# Patient Record
Sex: Female | Born: 1965 | Race: White | Hispanic: No | Marital: Single | Smoking: Never smoker
Health system: Southern US, Community
[De-identification: ages and names within clinical notes are randomized; demographics above are authoritative.]

## PROBLEM LIST (undated history)

## (undated) HISTORY — PX: MYOMECTOMY: SHX85

---

## 1997-08-26 ENCOUNTER — Other Ambulatory Visit: Admission: RE | Admit: 1997-08-26 | Discharge: 1997-08-26 | Payer: Self-pay | Admitting: Obstetrics and Gynecology

## 1998-09-07 ENCOUNTER — Other Ambulatory Visit: Admission: RE | Admit: 1998-09-07 | Discharge: 1998-09-07 | Payer: Self-pay | Admitting: Obstetrics and Gynecology

## 1998-10-19 ENCOUNTER — Other Ambulatory Visit: Admission: RE | Admit: 1998-10-19 | Discharge: 1998-10-19 | Payer: Self-pay | Admitting: Obstetrics and Gynecology

## 1999-01-12 ENCOUNTER — Encounter (INDEPENDENT_AMBULATORY_CARE_PROVIDER_SITE_OTHER): Payer: Self-pay | Admitting: Specialist

## 1999-01-12 ENCOUNTER — Other Ambulatory Visit: Admission: RE | Admit: 1999-01-12 | Discharge: 1999-01-12 | Payer: Self-pay | Admitting: Obstetrics and Gynecology

## 1999-05-19 ENCOUNTER — Other Ambulatory Visit: Admission: RE | Admit: 1999-05-19 | Discharge: 1999-05-19 | Payer: Self-pay | Admitting: Obstetrics and Gynecology

## 1999-05-19 ENCOUNTER — Encounter (INDEPENDENT_AMBULATORY_CARE_PROVIDER_SITE_OTHER): Payer: Self-pay

## 1999-09-28 ENCOUNTER — Other Ambulatory Visit: Admission: RE | Admit: 1999-09-28 | Discharge: 1999-09-28 | Payer: Self-pay | Admitting: Obstetrics and Gynecology

## 1999-11-04 ENCOUNTER — Encounter (INDEPENDENT_AMBULATORY_CARE_PROVIDER_SITE_OTHER): Payer: Self-pay | Admitting: Specialist

## 1999-11-04 ENCOUNTER — Other Ambulatory Visit: Admission: RE | Admit: 1999-11-04 | Discharge: 1999-11-04 | Payer: Self-pay | Admitting: Obstetrics and Gynecology

## 2000-04-18 ENCOUNTER — Other Ambulatory Visit: Admission: RE | Admit: 2000-04-18 | Discharge: 2000-04-18 | Payer: Self-pay | Admitting: Obstetrics and Gynecology

## 2001-02-13 ENCOUNTER — Other Ambulatory Visit: Admission: RE | Admit: 2001-02-13 | Discharge: 2001-02-13 | Payer: Self-pay | Admitting: Obstetrics and Gynecology

## 2001-08-13 ENCOUNTER — Other Ambulatory Visit: Admission: RE | Admit: 2001-08-13 | Discharge: 2001-08-13 | Payer: Self-pay | Admitting: Obstetrics and Gynecology

## 2001-09-17 ENCOUNTER — Encounter: Admission: RE | Admit: 2001-09-17 | Discharge: 2001-09-17 | Payer: Self-pay | Admitting: Family Medicine

## 2001-09-17 ENCOUNTER — Encounter: Payer: Self-pay | Admitting: Family Medicine

## 2002-03-14 ENCOUNTER — Other Ambulatory Visit: Admission: RE | Admit: 2002-03-14 | Discharge: 2002-03-14 | Payer: Self-pay | Admitting: Obstetrics and Gynecology

## 2002-10-17 ENCOUNTER — Other Ambulatory Visit: Admission: RE | Admit: 2002-10-17 | Discharge: 2002-10-17 | Payer: Self-pay | Admitting: Obstetrics and Gynecology

## 2003-07-30 ENCOUNTER — Other Ambulatory Visit: Admission: RE | Admit: 2003-07-30 | Discharge: 2003-07-30 | Payer: Self-pay | Admitting: Obstetrics and Gynecology

## 2004-01-31 ENCOUNTER — Emergency Department (HOSPITAL_COMMUNITY): Admission: EM | Admit: 2004-01-31 | Discharge: 2004-01-31 | Payer: Self-pay | Admitting: Internal Medicine

## 2004-03-23 ENCOUNTER — Inpatient Hospital Stay (HOSPITAL_COMMUNITY): Admission: RE | Admit: 2004-03-23 | Discharge: 2004-03-25 | Payer: Self-pay | Admitting: Obstetrics and Gynecology

## 2004-03-23 ENCOUNTER — Encounter (INDEPENDENT_AMBULATORY_CARE_PROVIDER_SITE_OTHER): Payer: Self-pay | Admitting: Specialist

## 2004-10-06 ENCOUNTER — Other Ambulatory Visit: Admission: RE | Admit: 2004-10-06 | Discharge: 2004-10-06 | Payer: Self-pay | Admitting: Obstetrics and Gynecology

## 2005-01-28 ENCOUNTER — Emergency Department (HOSPITAL_COMMUNITY): Admission: EM | Admit: 2005-01-28 | Discharge: 2005-01-28 | Payer: Self-pay | Admitting: Family Medicine

## 2007-12-23 ENCOUNTER — Encounter (INDEPENDENT_AMBULATORY_CARE_PROVIDER_SITE_OTHER): Payer: Self-pay | Admitting: Obstetrics & Gynecology

## 2007-12-23 ENCOUNTER — Inpatient Hospital Stay (HOSPITAL_COMMUNITY): Admission: AD | Admit: 2007-12-23 | Discharge: 2007-12-26 | Payer: Self-pay | Admitting: Obstetrics & Gynecology

## 2010-07-12 ENCOUNTER — Other Ambulatory Visit: Payer: Self-pay | Admitting: Dermatology

## 2010-10-19 NOTE — Op Note (Signed)
NAMENATAKI, MCCRUMB NO.:  [id number]   MEDICAL RECORD NO.:  [id number]          PATIENT TYPE:  INP   LOCATION:  9145                          FACILITY:  [facility]   PHYSICIAN:  Freddy Finner, M.D.   DATE OF BIRTH:  10-Nov-1965   DATE OF PROCEDURE:  12/23/2007  DATE OF DISCHARGE:                               OPERATIVE REPORT   PREOPERATIVE DIAGNOSES:  1. Intrauterine pregnancy at 36 weeks' gestation.  2. Previous myomectomy.  3. Recently diagnosed placenta previa in early labor.   POSTOPERATIVE DIAGNOSES:  1. Intrauterine pregnancy at 13 weeks' gestation.  2. Previous myomectomy.  3. Recently diagnosed placenta previa in early labor.  4. Delivery of viable female infant, Apgar's of 9 at one minute and 9 at      five minutes.  5. Posterior placenta with edge crossing the cervix.   ANESTHESIA:  Spinal.   SURGEON:  W. Varney Baas, MD   ASSISTANT:  Dr. Senaida Ores.   ESTIMATED INTRAOPERATIVE BLOOD LOSS:  Less than or equal to 800 mL.   INTRAOPERATIVE COMPLICATIONS:  None.   PACKS, DRAINS AND CATHETERS:  Foley catheter.  No packs or drains.   CONDITION:  Good to the recovery room.   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  The viable twin  is now at 28 weeks' gestation.  She was recently diagnosed with a  placenta previa on ultrasound in the office.  She had an episode of  spotting today and came to the hospital.  There was no active bleeding  at the time of her arrival.  She was having some mild contractions and  was given IV fluids and subcutaneous terbutaline.  This temporarily  resolved the contractions, which recurred.  She was tentatively  scheduled for delivery on December 26, 2007, anyway and given the  circumstances, it was elected to proceed with cesarean delivery at this  time.   PROCEDURE IN DETAIL:  The patient was brought to the operating room and  placed under  adequate spinal anesthesia, placed in dorsal recumbent  position with elevation of the right hip of 15 degrees.  The abdomen was  prepped and draped in the usual fashion after placement of a Foley  catheter using sterile technique.  She was given Ancef bolus IV.  Sterile drapes were applied.  A transverse lower abdominal incision was  made through an old scar and carried sharply down the fascia.  Fascia  was entered sharply with a scalpel and extended with Mayo scissors to  the external skin incision.  Rectus sheath was developed superiorly and  inferiorly with blunt and sharp dissection. Rectus muscles were divided  in the midline.  Peritoneum was elevated and entered sharply and  extended bluntly to the external skin incision.  A transverse incision  was made in the vesicoperitoneum overlying the lower segment and the  bladder dissected off the lower segment.  The incision on the uterus was  intentionally somewhat high and was made in a transverse direction.  Fluid was  clear.  The infant was then delivered with vacuum and  extracted without difficulty.  There were 2 loose loops of nuchal cord.  Apgar's are noted above.  Cord blood was obtained for routine screening  and for arterial blood gases.  Placenta and other parts of conception  were removed from the uterus.  This was confirmed by manual exploration  of the uterine cavity.  Placenta was sent to pathology.  Uterine  incision was grasped with ring forceps.  There was marked venous  bleeding because of the placenta previa, but this was easily controlled  with a running-locking stitch of 0 Monocryl, which closed the lower  segment.  One figure-of-eight was required near the left margin of the  incision for complete hemostasis.  Irrigation was carried out.  Hemostasis was complete.  The uterus itself was palpably normal.  There  were some probable omental-type adhesions on the posterior lower uterine  segment on the right side that  were left in place.  The tubes and  ovaries were normal.  No palpable additional myomas were noted.  With  correct instrument counts, the abdominal incision was closed in layers.  Running 0 Monocryl was used to close peritoneum and reapproximate the  rectus muscles.  Fascia was closed with a looped 0 PDS running from  angle to angle on either side.  Skin was closed with wide skin staples  and 1/4-inch Steri-Strips.  The patient was taken to the recovery room  in good condition.      Freddy Finner, M.D.  Electronically Signed     WRN/MEDQ  D:  12/23/2007  T:  12/24/2007  Job:  [id number]

## 2010-10-22 NOTE — Discharge Summary (Signed)
Allison Flores, Allison Flores                ACCOUNT NO.:  [id number]   MEDICAL RECORD NO.:  [id number]          PATIENT TYPE:  INP   LOCATION:  9145                          FACILITY:  [facility]   PHYSICIAN:  Dineen Kid. Rana Snare, M.D.    DATE OF BIRTH:  Oct 01, 1965   DATE OF ADMISSION:  12/23/2007  DATE OF DISCHARGE:  12/26/2007                               DISCHARGE SUMMARY   ADMITTING DIAGNOSES:  1. Intrauterine pregnancy at 67 weeks' estimated gestational age.  2. Previous myomectomy.  3. History of placenta previa.  4. Early onset of labor.   DISCHARGE DIAGNOSES:  1. Status post low-transverse cesarean section.  2. Viable female infant.   PROCEDURE:  Primary low-transverse cesarean section.   REASON FOR ADMISSION:  Please see written H&P.   HOSPITAL COURSE:  The patient is a 45 year old primigravida that was  admitted to [facility] at 36 weeks' estimated  gestational age in early labor.  The patient had had a history of a  myomectomy and also had had a known placenta previa.  The patient did  present to the hospital with early onset of labor with spotting and some  mild irregular contractions.  The patient was scheduled for cesarean  delivery in approximately 3 days.  The patient's prenatal history had  been significant for a twin gestation at the beginning of her pregnancy  with reabsorption of one of the twins.  The patient was counseled  regarding delivery at the time of her admission, and decision was made  to proceed with a cesarean delivery.  The patient was then transferred  to the operating room, where spinal anesthesia was administered without  difficulty.  A low-transverse incision was made with delivery of a  viable female infant weighing 7 pounds 0 ounces with Apgars of 9 at one  minute and 9 at five minutes.  The patient tolerated the procedure well  and was taken to the recovery room in stable condition.  On  postoperative day #1, the patient was without complaint.   Vital signs  were stable.  She was afebrile.  Fundus firm and nontender.  Abdominal  dressing was noted to be clean, dry, and intact.  Laboratory findings  showed a hemoglobin of 9.2.  On postoperative day #2, the patient was  without complaint.  Vital signs remained stable.  She was afebrile.  Abdomen soft.  Fundus firm and nontender.  Incision was clean, dry, and  intact.  She was ambulating well.  On postoperative day #3, the patient  was without complaint.  Vital signs were stable.  She was afebrile.  Fundus firm and nontender.  Incision was clean, dry, and intact.  Staples removed and the patient was later discharged home.   CONDITION ON DISCHARGE:  Stable.   DIET:  Regular as tolerated.   ACTIVITY:  No heavy lifting, no driving x2 weeks, no vaginal entry.   FOLLOWUP:  The patient to follow up in the office in 1-2 weeks for an  incision check.  She is to call for temperature greater than 100  degrees, persistent nausea, vomiting, heavy  vaginal bleeding, and/or  redness or drainage from the incisional site.   DISCHARGE MEDICATIONS:  Tylox #30 one p.o. every 4-6 hours p.r.n.,  Motrin 600 mg every 6 hours, prenatal vitamins one p.o. daily.      Julio Sicks, N.P.      Dineen Kid Rana Snare, M.D.  Electronically Signed    CC/MEDQ  D:  01/29/2008  T:  01/30/2008  Job:  [id number]

## 2010-10-22 NOTE — Discharge Summary (Signed)
[name], Allison Flores NO.:  [id number]   MEDICAL RECORD NO.:  [id number]          PATIENT TYPE:  INP   LOCATION:  0446                         [facility]ACILITY:  [facility]   PHYSICIAN:  Juluis Mire, M.D.   DATE OF BIRTH:  1966/04/02   DATE OF ADMISSION:  03/23/2004  DATE OF DISCHARGE:  03/25/2004                                 DISCHARGE SUMMARY   ADMISSION DIAGNOSIS:  Uterine fibroids.   DISCHARGE DIAGNOSIS:  Uterine fibroids.   PROCEDURE:  Exploratory laparotomy with myomectomy.   HISTORY OF PRESENT ILLNESS:  For complete history and physical, please see  dictated note.   HOSPITAL COURSE:  The patient underwent the above-noted surgery.  Pathology  is still pending from the surgical specimen.  Postoperative hemoglobin was  11.1.  She was discharged home on the second postoperative day, tolerating a  regular diet and ambulating without difficulty.  She was passing flatus and  voiding without difficulty.  The low transverse incision was intact.  Having  minimal vaginal bleeding.   No intraoperative complications.  __________ stay in the hospital.  The  patient is discharged home in stable condition.   DISPOSITION:  The patient is to avoid heavy lifting, vaginal entry, or  driving a car.  She was discharged home with __________ as needed for pain.  She is to watch for signs of infection, nausea, vomiting, increased  abdominal pain, or heavy vaginal bleeding.   FOLLOW UP:  She is to follow up early next week to remove the staples.      JSM/MEDQ  D:  03/25/2004  T:  03/25/2004  Job:  [id number]

## 2010-10-22 NOTE — H&P (Signed)
Allison Flores, [place] NO.:  [id number]   MEDICAL RECORD NO.:  [id number]          PATIENT TYPE:  INP   LOCATION:  0007                         FACILITY:  [facility]   PHYSICIAN:  Juluis Mire, M.D.   DATE OF BIRTH:  Oct 21, 1965   DATE OF ADMISSION:  03/23/2004  DATE OF DISCHARGE:                                HISTORY & PHYSICAL   REASON FOR ADMISSION:  Patient is a 45 year old nulligravida female who  presents for myomectomy.   HISTORY:  In relation to the present admission, patient has a history of  uterine fibroids.  Her cycles have become increasingly heavy.  She reports  10 days of total flow, 3-5 days being heavy changing pads and tampons every  30 minutes with clots.  She has had some increasing dysmenorrhea; her  hemoglobin has been depressed as low as 9.8; she is on birth control pills  at the present time.  Subsequent reevaluation of the uterine fibroids  revealed a large fibroid with endometrial impingement, the fibroid measured  approximately 8 x 6 cm; she had smaller fibroids measuring up to 2.8 cm;  ovaries were unremarkable.  We stabilized her periods with the birth control  pills.  Subsequently, with iron sulfate supplementation, we were able to  increase her hemoglobin to 13.3 but because of continued bleeding issues  associated with the fibroids she now presents for myomectomy.  We have  discussed other alternatives including radiological embolization versus  continued attempt at management with hormonal agents versus hysterectomy.  Patient does wish to proceed with myomectomy.   ALLERGIES:  No known drug allergies.   MEDICATIONS:  Medications include birth control pills.   PAST MEDICAL HISTORY:  Usual childhood diseases, no significant sequelae.   PAST SURGICAL HISTORY:  No previous surgical history.   PAST OBSTETRICAL HISTORY:  No previous obstetrical history.   FAMILY HISTORY:  Noncontributory.   SOCIAL HISTORY:  There is no tobacco or  alcohol use.   REVIEW OF SYSTEMS:  Noncontributory.   PHYSICAL EXAMINATION:  VITAL SIGNS:  Patient is afebrile with stable vital  signs.  HEENT:  Patient normocephalic.  Pupils equal, round, reactive to light and  accommodation.  Extraocular movements are intact.  Sclerae and conjunctivae  clear.  Oropharynx clear.  NECK:  Without thyromegaly.  BREASTS:  No discrete masses.  LUNGS:  Clear.  CARDIOVASCULAR SYSTEM:  Regular rhythm and rate without murmurs or gallops.  ABDOMEN:  Benign.  No mass, organomegaly, or tenderness.  PELVIC:  Normal external genitalia.  Vaginal mucosa clear.  Cervix  unremarkable.  Uterus enlarged 8-9 weeks with large prominent fibroid noted.  Adnexa unremarkable.  EXTREMITIES:  Trace edema.  NEUROLOGICAL:  Grossly within normal limits.   IMPRESSION:  Uterine fibroids with associated menorrhagia.   PLAN:  The patient will undergo myomectomy.  The risks have been discussed  including the risk of infection.  The risk of hemorrhage that could require  transfusion with associated risk of AIDS or hepatitis.  Possible need for  hysterectomy.  Possible injury to adjacent organs including bladder, bowel,  ureters that could require  further exploratory surgery.  Risk of deep venous  thrombosis and pulmonary embolus.  We have explained to her alternatives  again.  We have discussed with her the risk of myomectomy in terms of  leading to pelvic adhesions that could lead to infertility issues.  There is  also the risk of recurrent fibroids requiring further management concerns.  Patient expressed understanding of indications, risks, and above issues.      JSM/MEDQ  D:  03/23/2004  T:  03/23/2004  Job:  [id number]

## 2010-10-22 NOTE — Op Note (Signed)
Allison, Flores NO.:  [id number]   MEDICAL RECORD NO.:  [id number]          PATIENT TYPE:  INP   LOCATION:  0007                         [facility]ACILITY:  [facility]   PHYSICIAN:  Juluis Mire, M.D.   DATE OF BIRTH:  1965/10/09   DATE OF PROCEDURE:  03/23/2004  DATE OF DISCHARGE:                                 OPERATIVE REPORT   PREOPERATIVE DIAGNOSES:  Uterine fibroids.   POSTOPERATIVE DIAGNOSES:  Uterine fibroids.   OPERATION:  Exploratory laparotomy with multiple myomectomies.   SURGEON:  Juluis Mire, M.D.   ASSISTANT:  Duke Salvia. Marcelle Overlie, M.D.   ANESTHESIA:  General endotracheal.   ESTIMATED BLOOD LOSS:  Approximately 300 mL.   PACKS/DRAINS:  None.   INTRAOPERATIVE BLOOD REPLACED:  None.   COMPLICATIONS:  None.   INDICATIONS FOR PROCEDURE:  As dictated in the history and physical.   DESCRIPTION OF PROCEDURE:  The patient was taken to the OR and placed in the  supine position. After a satisfactory level of general endotracheal  anesthesia was obtained, the abdomen was prepped out with Betadine and  draped in a sterile field.  A low transverse skin incision was made with a  knife and carried through the subcutaneous tissue. The fascia was entered  sharply, incision in the fascia extended laterally. The fascia was taken  down off the muscle inferiorly and superiorly.  The rectus muscles were  separated in the midline.  The perineum was entered, incision in the  perineum extended both superiorly and inferiorly. The uterus was then  delivered through the incision. Multiple fibroids were noted. The tubes and  ovaries were unremarkable. She had a large posterior fibroid. The area was  injected with a dilute solution of Pitressin and saline. An incision over  the fibroid was made using the Bovie. We dissected down to the fibroid. We  were able to remove approximately an 8 cm fibroid through this incision. The  endometrium was entered during this  procedure and the myometrium was noted,  there did not appear to be any tubal disruption. At this point in time, we  reapproximated the endometrium with a running suture of 3-0 Monocryl. Next,  the myometrium was reapproximated with interrupted figure-of-eights of 2-0  Vicryl. A second layer of 2-0 Vicryl in a running fashion was used to bring  the myometrium closer together. We had fairly good hemostasis.  The uterine  serosa was brought together with a running suture of 3-0 Monocryl. Next, two  pedunculated subserosal fibroids, one on the right fundal area and one on  the left fundal area were injected with dilute solution of Pitressin and  saline and excised. There was not a deep myometrial incision so the uterine  serosa was brought together with a running suture of 3-0 Monocryl with good  hemostasis. We then went to the anterior portion of the uterus.  There was  one 2 cm fibroid located there, it was well below the bladder area.  Again  this was injected with Pitressin and saline. An incision was made with the  Bovie over the fibroid  and it was removed easily. The deep myometrium was  closed with two figure-of-eights of 2-0 Vicryl, the serosa was closed with a  running suture of 3-0 Monocryl. One posterior fibroid on the lower uterine  segment was noted. This was pedunculated and actually just came off in our  hand. We over sewed the area with 3-0 Monocryl brining about hemostasis. At  this point in time, we thoroughly irrigated the pelvis, we had good  hemostasis.  All the incision areas appeared to be doing well. The tubes and  ovaries again were unremarkable, no other pelvic processes were noted. At  this point in time, the uterus was returned to the abdominal cavity.  Again  we irrigated, we had good hemostasis, muscles reapproximated with running  suture of 3-0 Vicryl, the fascia closed with running suture #0 PDS, skin was  closed with staples and Steri-Strips. Sponge, instrument  and needle count  reported as correct by circulating nurse x2, Foley catheter remained clear  at the time of closure. The patient tolerated the procedure well and was  returned to the recovery room in good condition.      JSM/MEDQ  D:  03/23/2004  T:  03/23/2004  Job:  [id number]

## 2011-03-04 LAB — TYPE AND SCREEN: ABO/RH(D): A POS

## 2011-03-04 LAB — CBC
HCT: 36.7
Hemoglobin: 12.5
MCHC: 34.1
MCV: 94.3
Platelets: 167
Platelets: 229
RBC: 3.89
RDW: 14.1
RDW: 14.2
WBC: 10.2
WBC: 11.9 — ABNORMAL HIGH

## 2011-03-04 LAB — COMPREHENSIVE METABOLIC PANEL
ALT: 13
AST: 20
Albumin: 2.6 — ABNORMAL LOW
Calcium: 8.9
Chloride: 104
Creatinine, Ser: 0.43
GFR calc Af Amer: >60
Sodium: 136
Total Bilirubin: 0.4

## 2011-03-04 LAB — URIC ACID: Uric Acid, Serum: 3.5

## 2011-03-04 LAB — ABO/RH: ABO/RH(D): A POS

## 2011-07-20 ENCOUNTER — Other Ambulatory Visit: Payer: Self-pay | Admitting: Dermatology

## 2011-09-14 ENCOUNTER — Other Ambulatory Visit: Payer: Self-pay | Admitting: Dermatology

## 2012-09-28 ENCOUNTER — Other Ambulatory Visit: Payer: Self-pay | Admitting: Obstetrics and Gynecology

## 2012-09-28 DIAGNOSIS — R928 Other abnormal and inconclusive findings on diagnostic imaging of breast: Secondary | ICD-10-CM

## 2012-10-11 ENCOUNTER — Ambulatory Visit
Admission: RE | Admit: 2012-10-11 | Discharge: 2012-10-11 | Disposition: A | Discharge status: Home / Self Care | Payer: BC Managed Care – PPO | Source: Ambulatory Visit | Attending: Obstetrics and Gynecology | Admitting: Obstetrics and Gynecology

## 2012-10-11 DIAGNOSIS — R928 Other abnormal and inconclusive findings on diagnostic imaging of breast: Secondary | ICD-10-CM

## 2013-02-20 ENCOUNTER — Emergency Department (HOSPITAL_COMMUNITY)
Admission: EM | Admit: 2013-02-20 | Discharge: 2013-02-20 | Disposition: A | Discharge status: Home / Self Care | Payer: BC Managed Care – PPO | Source: Home / Self Care | Attending: Emergency Medicine | Admitting: Emergency Medicine

## 2013-02-20 ENCOUNTER — Encounter (HOSPITAL_COMMUNITY): Payer: Self-pay | Admitting: Emergency Medicine

## 2013-02-20 DIAGNOSIS — J020 Streptococcal pharyngitis: Secondary | ICD-10-CM

## 2013-02-20 LAB — POCT RAPID STREP A: Streptococcus, Group A Screen (Direct): POSITIVE — AB

## 2013-02-20 MED ORDER — PENICILLIN G BENZATHINE 1200000 UNIT/2ML IM SUSP
INTRAMUSCULAR | Status: AC
  Filled 2013-02-20: qty 2

## 2013-02-20 MED ORDER — PENICILLIN G BENZATHINE 1200000 UNIT/2ML IM SUSP
1.2000 10*6.[IU] | Freq: Once | INTRAMUSCULAR | Status: AC
  Administered 2013-02-20: 1.2 10*6.[IU] via INTRAMUSCULAR

## 2013-02-20 NOTE — ED Provider Notes (Signed)
Chief Complaint:   Chief Complaint  Patient presents with  . Sore Throat    History of Present Illness:   Allison Flores is a 47 year old female who brought her son in 5 days ago with strep. At that time she had a scratchy throat which has now become sore and she has pain on swallowing. She also feels achy, has had a fever of up to 101, chills, nasal congestion, rhinorrhea, headache, and a slight cough. She denies any GI symptoms. She has had a history of strep throat in the past.  Review of Systems:  Other than as noted above, the patient denies any of the following symptoms. Systemic:  No fever, chills, sweats, fatigue, myalgias, headache, or anorexia. Eye:  No redness, pain or drainage. ENT:  No earache, ear congestion, nasal congestion, sneezing, rhinorrhea, sinus pressure, sinus pain, or post nasal drip. Lungs:  No cough, sputum production, wheezing, shortness of breath, or chest pain. GI:  No abdominal pain, nausea, vomiting, or diarrhea. Skin:  No rash or itching.  PMFSH:  Past medical history, family history, social history, meds, allergies, and nurse's notes were reviewed.  There is no known exposure to strep or mono.  No prior history of step or mono.  The patient denies use of tobacco.   Physical Exam:   Vital signs:  BP 123/86  Pulse 76  Temp(Src) 98.3 F (36.8 C) (Oral)  Resp 18  SpO2 96%  LMP 01/23/2013 General:  Alert, in no distress. Eye:  No conjunctival injection or drainage. Lids were normal. ENT:  TMs and canals were normal, without erythema or inflammation.  Nasal mucosa was clear and uncongested, without drainage.  Mucous membranes were moist.  Exam of pharynx reveals swelling and tonsillar exudate.  There were no oral ulcerations or lesions. Neck:  Supple, anterior cervical nodes were enlarged and tender. Lungs:  No respiratory distress.  Lungs were clear to auscultation, without wheezes, rales or rhonchi.  Breath sounds were clear and equal bilaterally.  Heart:   Regular rhythm, without gallops, murmers or rubs. Skin:  Clear, warm, and dry, without rash or lesions.  Labs:   Results for orders placed during the hospital encounter of 02/20/13  POCT RAPID STREP A ([facility] CARE ONLY)      Result Value Range   Streptococcus, Group A Screen (Direct) POSITIVE (*) NEGATIVE    Course in Urgent Care Center:   Given LA Bicillin 1.2 million units IM.  Assessment:  The encounter diagnosis was Strep throat.  No evidence of peritonsillar abscess.  Plan:   1.  The following meds were prescribed:   New Prescriptions   No medications on file   2.  The patient was instructed in symptomatic care including hot saline gargles, throat lozenges, infectious precautions, and need to trade out toothbrush. Handouts were given. 3.  The patient was told to return if becoming worse in any way, if no better in 3 or 4 days, and given some red flag symptoms such as difficulty swallowing or breathing that would indicate earlier return. 4.  Follow up here if necessary.    Reuben Likes, MD 02/20/13 [phone]

## 2013-02-20 NOTE — ED Notes (Signed)
Pt c/o sore throat onset Saturday Pt's son was seen here on Sat for a pos strep Sxs today include: fevers, BA, ST, facial pressure Took 800mg  of ibup this am around 0400 She is alert w/no signs of acute distress.

## 2013-08-05 ENCOUNTER — Other Ambulatory Visit: Payer: Self-pay | Admitting: Obstetrics and Gynecology

## 2013-08-05 DIAGNOSIS — N6320 Unspecified lump in the left breast, unspecified quadrant: Secondary | ICD-10-CM

## 2013-08-16 ENCOUNTER — Ambulatory Visit
Admission: RE | Admit: 2013-08-16 | Discharge: 2013-08-16 | Disposition: A | Discharge status: Home / Self Care | Payer: BC Managed Care – PPO | Source: Ambulatory Visit | Attending: Obstetrics and Gynecology | Admitting: Obstetrics and Gynecology

## 2013-08-16 DIAGNOSIS — N6320 Unspecified lump in the left breast, unspecified quadrant: Secondary | ICD-10-CM

## 2014-10-16 ENCOUNTER — Other Ambulatory Visit: Payer: Self-pay | Admitting: Obstetrics and Gynecology

## 2014-10-17 LAB — CYTOLOGY - PAP

## 2016-10-12 ENCOUNTER — Other Ambulatory Visit: Payer: Self-pay | Admitting: Family Medicine

## 2016-10-12 ENCOUNTER — Ambulatory Visit
Admission: RE | Admit: 2016-10-12 | Discharge: 2016-10-12 | Disposition: A | Discharge status: Home / Self Care | Payer: 59 | Source: Ambulatory Visit | Attending: Family Medicine | Admitting: Family Medicine

## 2016-10-12 DIAGNOSIS — X501XXA Overexertion from prolonged static or awkward postures, initial encounter: Secondary | ICD-10-CM

## 2016-10-12 DIAGNOSIS — M25531 Pain in right wrist: Secondary | ICD-10-CM | POA: Diagnosis not present

## 2016-10-12 DIAGNOSIS — S6391XA Sprain of unspecified part of right wrist and hand, initial encounter: Secondary | ICD-10-CM | POA: Diagnosis not present

## 2016-11-01 DIAGNOSIS — Z01419 Encounter for gynecological examination (general) (routine) without abnormal findings: Secondary | ICD-10-CM | POA: Diagnosis not present

## 2016-11-09 DIAGNOSIS — Z13820 Encounter for screening for osteoporosis: Secondary | ICD-10-CM | POA: Diagnosis not present

## 2017-02-24 DIAGNOSIS — Z1211 Encounter for screening for malignant neoplasm of colon: Secondary | ICD-10-CM | POA: Diagnosis not present

## 2017-03-06 DIAGNOSIS — Z23 Encounter for immunization: Secondary | ICD-10-CM | POA: Diagnosis not present

## 2017-03-06 DIAGNOSIS — E7800 Pure hypercholesterolemia, unspecified: Secondary | ICD-10-CM | POA: Diagnosis not present

## 2017-03-06 DIAGNOSIS — Z0000 Encounter for general adult medical examination without abnormal findings: Secondary | ICD-10-CM | POA: Diagnosis not present

## 2017-03-06 DIAGNOSIS — E559 Vitamin D deficiency, unspecified: Secondary | ICD-10-CM | POA: Diagnosis not present

## 2017-03-06 DIAGNOSIS — I10 Essential (primary) hypertension: Secondary | ICD-10-CM | POA: Diagnosis not present

## 2017-03-29 DIAGNOSIS — L821 Other seborrheic keratosis: Secondary | ICD-10-CM | POA: Diagnosis not present

## 2017-03-29 DIAGNOSIS — D2361 Other benign neoplasm of skin of right upper limb, including shoulder: Secondary | ICD-10-CM | POA: Diagnosis not present

## 2017-03-29 DIAGNOSIS — D225 Melanocytic nevi of trunk: Secondary | ICD-10-CM | POA: Diagnosis not present

## 2017-10-16 DIAGNOSIS — Z780 Asymptomatic menopausal state: Secondary | ICD-10-CM | POA: Diagnosis not present

## 2017-12-04 DIAGNOSIS — Z6834 Body mass index (BMI) 34.0-34.9, adult: Secondary | ICD-10-CM | POA: Diagnosis not present

## 2017-12-04 DIAGNOSIS — Z1231 Encounter for screening mammogram for malignant neoplasm of breast: Secondary | ICD-10-CM | POA: Diagnosis not present

## 2017-12-04 DIAGNOSIS — Z01419 Encounter for gynecological examination (general) (routine) without abnormal findings: Secondary | ICD-10-CM | POA: Diagnosis not present

## 2018-02-08 DIAGNOSIS — Z780 Asymptomatic menopausal state: Secondary | ICD-10-CM | POA: Diagnosis not present

## 2018-03-26 DIAGNOSIS — I10 Essential (primary) hypertension: Secondary | ICD-10-CM | POA: Diagnosis not present

## 2018-03-26 DIAGNOSIS — Z0000 Encounter for general adult medical examination without abnormal findings: Secondary | ICD-10-CM | POA: Diagnosis not present

## 2018-03-26 DIAGNOSIS — Z23 Encounter for immunization: Secondary | ICD-10-CM | POA: Diagnosis not present

## 2018-03-26 DIAGNOSIS — E559 Vitamin D deficiency, unspecified: Secondary | ICD-10-CM | POA: Diagnosis not present

## 2018-03-26 DIAGNOSIS — E7800 Pure hypercholesterolemia, unspecified: Secondary | ICD-10-CM | POA: Diagnosis not present

## 2018-04-19 DIAGNOSIS — D485 Neoplasm of uncertain behavior of skin: Secondary | ICD-10-CM | POA: Diagnosis not present

## 2018-04-19 DIAGNOSIS — D225 Melanocytic nevi of trunk: Secondary | ICD-10-CM | POA: Diagnosis not present

## 2018-04-19 DIAGNOSIS — L570 Actinic keratosis: Secondary | ICD-10-CM | POA: Diagnosis not present

## 2018-04-19 DIAGNOSIS — Z86018 Personal history of other benign neoplasm: Secondary | ICD-10-CM | POA: Diagnosis not present

## 2018-04-19 DIAGNOSIS — Z85828 Personal history of other malignant neoplasm of skin: Secondary | ICD-10-CM | POA: Diagnosis not present

## 2018-05-16 DIAGNOSIS — N950 Postmenopausal bleeding: Secondary | ICD-10-CM | POA: Diagnosis not present

## 2018-05-16 DIAGNOSIS — D485 Neoplasm of uncertain behavior of skin: Secondary | ICD-10-CM | POA: Diagnosis not present

## 2019-08-29 ENCOUNTER — Ambulatory Visit: Payer: 59 | Attending: Internal Medicine

## 2019-08-29 DIAGNOSIS — Z23 Encounter for immunization: Secondary | ICD-10-CM

## 2019-08-29 NOTE — Progress Notes (Signed)
   Covid-19 Vaccination Clinic  Name:  Allison Flores    MRN: [id number] DOB: 13-Jun-1965  08/29/2019  [name] was observed post Covid-19 immunization for 15 minutes without incident. She was provided with Vaccine Information Sheet and instruction to access the V-Safe system.   [name] was instructed to call 911 with any severe reactions post vaccine: Marland Kitchen Difficulty breathing  . Swelling of face and throat  . A fast heartbeat  . A bad rash all over body  . Dizziness and weakness   Immunizations Administered    Name Date Dose VIS Date Route   Pfizer COVID-19 Vaccine 08/29/2019  9:37 AM 0.3 mL 05/17/2019 Intramuscular   Manufacturer: [place]   Lot: [id number]   [place]: [id number]

## 2019-09-23 ENCOUNTER — Ambulatory Visit: Payer: 59 | Attending: Internal Medicine

## 2019-09-23 DIAGNOSIS — Z23 Encounter for immunization: Secondary | ICD-10-CM

## 2019-09-23 NOTE — Progress Notes (Signed)
   Covid-19 Vaccination Clinic  Name:  Allison Flores    MRN: [id number] DOB: January 08, 1966  09/23/2019  Allison Flores was observed post Covid-19 immunization for 15 minutes without incident. She was provided with Vaccine Information Sheet and instruction to access the V-Safe system.   Allison Flores was instructed to call 911 with any severe reactions post vaccine: Marland Kitchen Difficulty breathing  . Swelling of face and throat  . A fast heartbeat  . A bad rash all over body  . Dizziness and weakness   Immunizations Administered    Name Date Dose VIS Date Route   Pfizer COVID-19 Vaccine 09/23/2019  9:06 AM 0.3 mL 07/31/2018 Intramuscular   Manufacturer: [place]   Lot: [id number]   [place]: [id number]

## 2020-09-17 ENCOUNTER — Other Ambulatory Visit: Payer: Self-pay | Admitting: Sports Medicine

## 2020-09-17 DIAGNOSIS — M25571 Pain in right ankle and joints of right foot: Secondary | ICD-10-CM

## 2020-09-28 ENCOUNTER — Other Ambulatory Visit: Payer: Self-pay

## 2020-09-30 ENCOUNTER — Other Ambulatory Visit: Payer: Self-pay

## 2020-09-30 ENCOUNTER — Ambulatory Visit
Admission: RE | Admit: 2020-09-30 | Discharge: 2020-09-30 | Disposition: A | Discharge status: Home / Self Care | Payer: BC Managed Care – PPO | Source: Ambulatory Visit | Attending: Sports Medicine | Admitting: Sports Medicine

## 2020-09-30 DIAGNOSIS — M25571 Pain in right ankle and joints of right foot: Secondary | ICD-10-CM

## 2020-10-15 ENCOUNTER — Other Ambulatory Visit: Payer: Self-pay

## 2020-10-15 ENCOUNTER — Emergency Department (HOSPITAL_COMMUNITY): Payer: BC Managed Care – PPO

## 2020-10-15 ENCOUNTER — Encounter (HOSPITAL_COMMUNITY): Payer: Self-pay

## 2020-10-15 ENCOUNTER — Emergency Department (HOSPITAL_COMMUNITY)
Admission: EM | Admit: 2020-10-15 | Discharge: 2020-10-15 | Disposition: A | Discharge status: Home / Self Care | Payer: BC Managed Care – PPO | Attending: Emergency Medicine | Admitting: Emergency Medicine

## 2020-10-15 DIAGNOSIS — N201 Calculus of ureter: Secondary | ICD-10-CM | POA: Insufficient documentation

## 2020-10-15 DIAGNOSIS — R1031 Right lower quadrant pain: Secondary | ICD-10-CM | POA: Diagnosis present

## 2020-10-15 LAB — URINALYSIS, ROUTINE W REFLEX MICROSCOPIC
Bacteria, UA: NONE SEEN
Bilirubin Urine: NEGATIVE
Glucose, UA: NEGATIVE mg/dL
Ketones, ur: NEGATIVE mg/dL
Nitrite: NEGATIVE
Protein, ur: NEGATIVE mg/dL
Specific Gravity, Urine: 1.02 (ref 1.005–1.030)
pH: 6 (ref 5.0–8.0)

## 2020-10-15 LAB — COMPREHENSIVE METABOLIC PANEL
ALT: 17 U/L (ref 0–44)
AST: 18 U/L (ref 15–41)
Albumin: 4 g/dL (ref 3.5–5.0)
Alkaline Phosphatase: 74 U/L (ref 38–126)
Anion gap: 7 (ref 5–15)
BUN: 19 mg/dL (ref 6–20)
CO2: 25 mmol/L (ref 22–32)
Calcium: 9.1 mg/dL (ref 8.9–10.3)
Chloride: 108 mmol/L (ref 98–111)
Creatinine, Ser: 0.78 mg/dL (ref 0.44–1.00)
GFR, Estimated: >60 mL/min (ref >60)
Glucose, Bld: 153 mg/dL — ABNORMAL HIGH (ref 70–99)
Potassium: 3.7 mmol/L (ref 3.5–5.1)
Sodium: 140 mmol/L (ref 135–145)
Total Bilirubin: 0.4 mg/dL (ref 0.3–1.2)
Total Protein: 7.7 g/dL (ref 6.5–8.1)

## 2020-10-15 LAB — CBC
HCT: 42.3 % (ref 36.0–46.0)
Hemoglobin: 14.3 g/dL (ref 12.0–15.0)
MCH: 30.5 pg (ref 26.0–34.0)
MCHC: 33.8 g/dL (ref 30.0–36.0)
MCV: 90.2 fL (ref 80.0–100.0)
Platelets: 261 10*3/uL (ref 150–400)
RBC: 4.69 MIL/uL (ref 3.87–5.11)
RDW: 13.2 % (ref 11.5–15.5)
WBC: 11.7 10*3/uL — ABNORMAL HIGH (ref 4.0–10.5)
nRBC: 0 % (ref 0.0–0.2)

## 2020-10-15 LAB — I-STAT BETA HCG BLOOD, ED (MC, WL, AP ONLY): I-stat hCG, quantitative: <5 m[IU]/mL (ref <5)

## 2020-10-15 LAB — LIPASE, BLOOD: Lipase: 30 U/L (ref 11–51)

## 2020-10-15 MED ORDER — IBUPROFEN 800 MG PO TABS: 800.0000 mg | ORAL_TABLET | Freq: Three times a day (TID) | ORAL | 0 refills | Status: AC | PRN

## 2020-10-15 MED ORDER — FENTANYL CITRATE (PF) 100 MCG/2ML IJ SOLN
50.0000 ug | Freq: Once | INTRAMUSCULAR | Status: AC
  Administered 2020-10-15: 50 ug via INTRAVENOUS
  Filled 2020-10-15: qty 2

## 2020-10-15 MED ORDER — KETOROLAC TROMETHAMINE 30 MG/ML IJ SOLN
30.0000 mg | Freq: Once | INTRAMUSCULAR | Status: AC
  Administered 2020-10-15: 30 mg via INTRAVENOUS
  Filled 2020-10-15: qty 1

## 2020-10-15 MED ORDER — TAMSULOSIN HCL 0.4 MG PO CAPS: 0.4000 mg | ORAL_CAPSULE | Freq: Every day | ORAL | 0 refills | Status: AC

## 2020-10-15 MED ORDER — HYDROCODONE-ACETAMINOPHEN 5-325 MG PO TABS: 1.0000 | ORAL_TABLET | Freq: Four times a day (QID) | ORAL | 0 refills | Status: AC | PRN

## 2020-10-15 MED ORDER — ONDANSETRON HCL 4 MG/2ML IJ SOLN
4.0000 mg | Freq: Once | INTRAMUSCULAR | Status: AC
  Administered 2020-10-15: 4 mg via INTRAVENOUS
  Filled 2020-10-15: qty 2

## 2020-10-15 NOTE — ED Triage Notes (Signed)
Pt reports RLQ abdominal pain beginning yesterday morning. Pt vomited several times and pain went away. Pain returned this morning with nausea.

## 2020-10-15 NOTE — ED Provider Notes (Signed)
[facility] Provider Note  CSN: [id number] Arrival date & time: 10/15/20 6712    History Chief Complaint  Patient presents with  . Abdominal Pain    HPI  Allison Flores is a 55 y.o. female with no significant PMH presents for evaluation of RLQ pain, cramping pain that began for a short time yesterday then returned and has been constant through the night. She reports associated vomiting, no diarrhea or constipation. No fever. No particular provoking or relieving factors and she is having a hard time finding a comfortable position. She does not have any prior history of abdominal problems. Denies dysuria or hematuria.    History reviewed. No pertinent past medical history.  Past Surgical History:  Procedure Laterality Date  . CESAREAN SECTION    . MYOMECTOMY      No family history on file.  Social History   Tobacco Use  . Smoking status: Never Smoker  . Smokeless tobacco: Never Used  Substance Use Topics  . Alcohol use: No  . Drug use: No     Home Medications Prior to Admission medications   Medication Sig Start Date End Date Taking? Authorizing Provider  HYDROcodone-acetaminophen (NORCO/VICODIN) 5-325 MG tablet Take 1 tablet by mouth every 6 (six) hours as needed for severe pain. 10/15/20  Yes Truddie Hidden, MD  ibuprofen (ADVIL) 800 MG tablet Take 1 tablet (800 mg total) by mouth every 8 (eight) hours as needed for moderate pain. 10/15/20  Yes Truddie Hidden, MD  tamsulosin (FLOMAX) 0.4 MG CAPS capsule Take 1 capsule (0.4 mg total) by mouth daily. 10/15/20  Yes Truddie Hidden, MD     Allergies    Patient has no known allergies.   Review of Systems   Review of Systems A comprehensive review of systems was completed and negative except as noted in HPI.    Physical Exam BP (!) 156/91 (BP Location: Left Arm)   Pulse (!) 53   Temp 97.6 F (36.4 C) (Oral)   Resp 15   Ht 5\' 7"  (1.702 m)   Wt 102.1 kg   SpO2 98%   BMI 35.24  kg/m   Physical Exam Vitals and nursing note reviewed.  Constitutional:      Appearance: Normal appearance.  HENT:     Head: Normocephalic and atraumatic.     Nose: Nose normal.     Mouth/Throat:     Mouth: Mucous membranes are moist.  Eyes:     Extraocular Movements: Extraocular movements intact.     Conjunctiva/sclera: Conjunctivae normal.  Cardiovascular:     Rate and Rhythm: Normal rate.  Pulmonary:     Effort: Pulmonary effort is normal.     Breath sounds: Normal breath sounds.  Abdominal:     General: Abdomen is flat.     Palpations: Abdomen is soft.     Tenderness: There is no abdominal tenderness. There is no guarding. Negative signs include Murphy's sign and McBurney's sign.  Musculoskeletal:        General: No swelling. Normal range of motion.     Cervical back: Neck supple.  Skin:    General: Skin is warm and dry.  Neurological:     General: No focal deficit present.     Mental Status: She is alert.  Psychiatric:        Mood and Affect: Mood normal.      ED Results / Procedures / Treatments   Labs (all labs ordered are listed, but only  abnormal results are displayed) Labs Reviewed  COMPREHENSIVE METABOLIC PANEL - Abnormal; Notable for the following components:      Result Value   Glucose, Bld 153 (*)    All other components within normal limits  CBC - Abnormal; Notable for the following components:   WBC 11.7 (*)    All other components within normal limits  URINALYSIS, ROUTINE W REFLEX MICROSCOPIC - Abnormal; Notable for the following components:   Hgb urine dipstick SMALL (*)    Leukocytes,Ua SMALL (*)    All other components within normal limits  LIPASE, BLOOD  I-STAT BETA HCG BLOOD, ED ([facility], [facility], [facility] ONLY)    EKG None  Radiology CT Renal Stone Study  Result Date: 10/15/2020 CLINICAL DATA:  Right lower quadrant pain for 2 days. EXAM: CT ABDOMEN AND PELVIS WITHOUT CONTRAST TECHNIQUE: Multidetector CT imaging of the abdomen and pelvis was  performed following the standard protocol without IV contrast. COMPARISON:  None. FINDINGS: Lower chest: Unremarkable Hepatobiliary: No focal abnormality in the liver on this study without intravenous contrast. There is no evidence for gallstones, gallbladder wall thickening, or pericholecystic fluid. No intrahepatic or extrahepatic biliary dilation. Pancreas: No focal mass lesion. No dilatation of the main duct. No intraparenchymal cyst. No peripancreatic edema. Spleen: No splenomegaly. No focal mass lesion. Adrenals/Urinary Tract: No adrenal nodule or mass. Left kidney and ureter unremarkable. Perinephric edema noted around the right kidney with mild fullness of the right intrarenal collecting system and ureter. 3 mm distal right ureteral stone identified just proximal to the UVJ (axial 81/2). Bladder is decompressed but unremarkable. Stomach/Bowel: Stomach is unremarkable. No gastric wall thickening. No evidence of outlet obstruction. Duodenum is normally positioned as is the ligament of Treitz. No small bowel wall thickening. No small bowel dilatation. The terminal ileum is normal. The appendix is normal. No gross colonic mass. No colonic wall thickening. Vascular/Lymphatic: No abdominal aortic aneurysm. There is no gastrohepatic or hepatoduodenal ligament lymphadenopathy. No retroperitoneal or mesenteric lymphadenopathy. No pelvic sidewall lymphadenopathy. Reproductive: The uterus is unremarkable.  There is no adnexal mass. Other: No intraperitoneal free fluid. Musculoskeletal: No worrisome lytic or sclerotic osseous abnormality. IMPRESSION: 1. 3 mm distal right ureteral stone with mild secondary changes in the right kidney and ureter. 2. Otherwise unremarkable exam. Electronically Signed   By: Misty Stanley M.D.   On: 10/15/2020 08:45    Procedures Procedures  Medications Ordered in the ED Medications  fentaNYL (SUBLIMAZE) injection 50 mcg (50 mcg Intravenous Given 10/15/20 0756)  ondansetron (ZOFRAN)  injection 4 mg (4 mg Intravenous Given 10/15/20 0754)  ketorolac (TORADOL) 30 MG/ML injection 30 mg (30 mg Intravenous Given 10/15/20 0907)     MDM Rules/Calculators/A&P MDM Patient with colicky RLQ pain, non radiating but abdominal exam is benign. She appears uncomfortable, suspect renal stone, less likely appendicitis or ovarian process. Will check labs, pain/nausea meds for comfort and send for CT.   ED Course  I have reviewed the triage vital signs and the nursing notes.  Pertinent labs & imaging results that were available during my care of the patient were reviewed by me and considered in my medical decision making (see chart for details).  Clinical Course as of 10/15/20 1345  Thu Oct 15, 2020  0802 CBC shows mildly increased WBC,otherwise neg. HCG is neg.  [CS]  0804 UA without signs of infection or significant hematuria.  [CS]  0831 CMP lipase are unremarkable.CT appears to show a small distal R ureteral stone with mild-moderate hydronephrosis. Awaiting Rads read.  [  CS]  [id number] Final CT report confirms small distal stone. Patient still having some pain, will give a dose of Toradol and reassess.  [CS]    Clinical Course User Index [CS] Truddie Hidden, MD    Final Clinical Impression(s) / ED Diagnoses Final diagnoses:  Right ureteral stone    Rx / DC Orders ED Discharge Orders         Ordered    ibuprofen (ADVIL) 800 MG tablet  Every 8 hours PRN        10/15/20 0940    HYDROcodone-acetaminophen (NORCO/VICODIN) 5-325 MG tablet  Every 6 hours PRN        10/15/20 0940    tamsulosin (FLOMAX) 0.4 MG CAPS capsule  Daily        10/15/20 0940           Truddie Hidden, MD 10/15/20 1345

## 2021-12-11 IMAGING — CT CT RENAL STONE PROTOCOL
2 of 4 series · 16 of 46 positions shown, 18 images · non-contrast
Comparison: None.

CLINICAL DATA: Right lower quadrant pain for 2 days.

EXAM:
CT ABDOMEN AND PELVIS WITHOUT CONTRAST
TECHNIQUE: Multidetector CT imaging of the abdomen and pelvis was performed
following the standard protocol without IV contrast.

[Series 2: axial st · axial · 0.98mm/px · z∈[+966,+1416]mm · 13 of 100 slices shown, 15 images]
[im 5/100  soft-tissue]
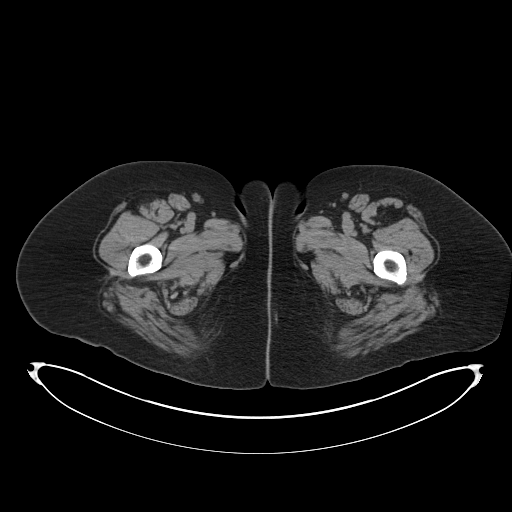
[im 5/100  bone]
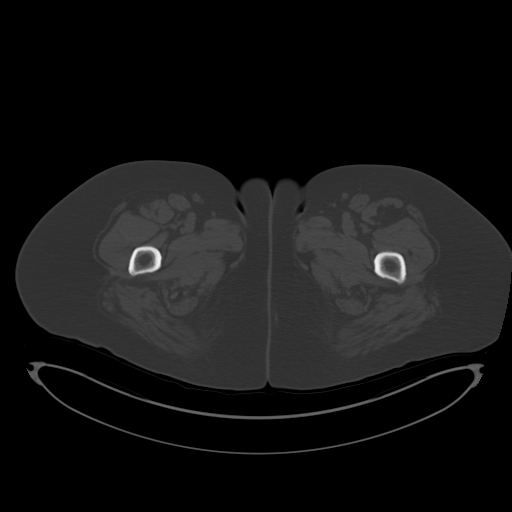
[im 15/100  soft-tissue]
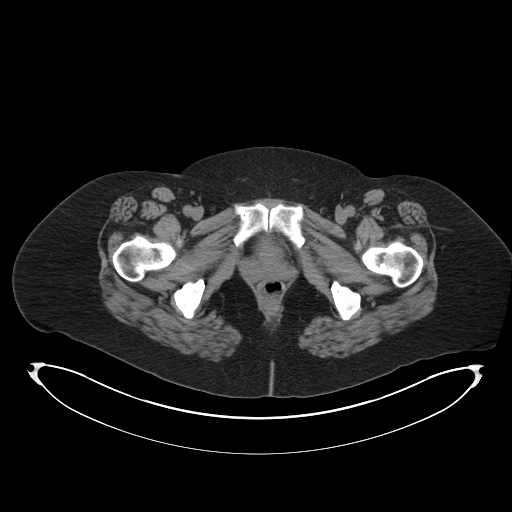
[im 19/100  soft-tissue]
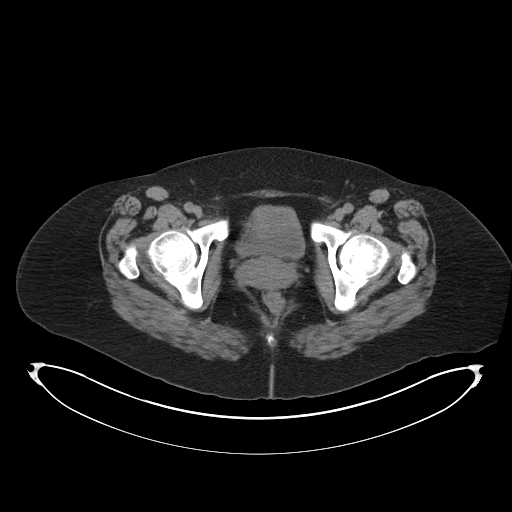
[im 29/100  soft-tissue]
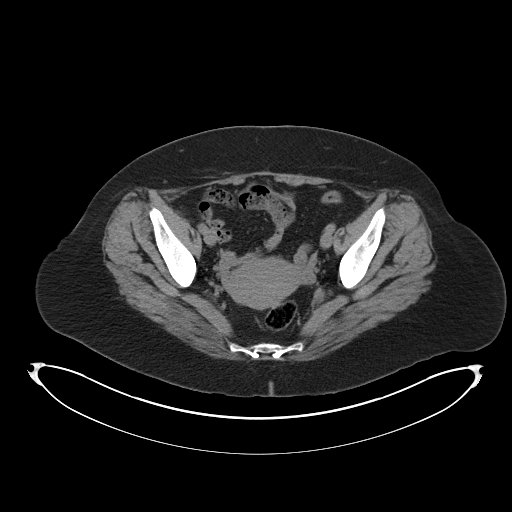
[im 34/100  soft-tissue]
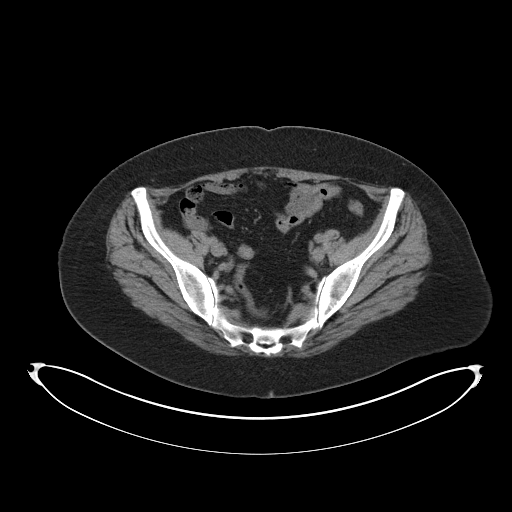
[im 43/100  soft-tissue]
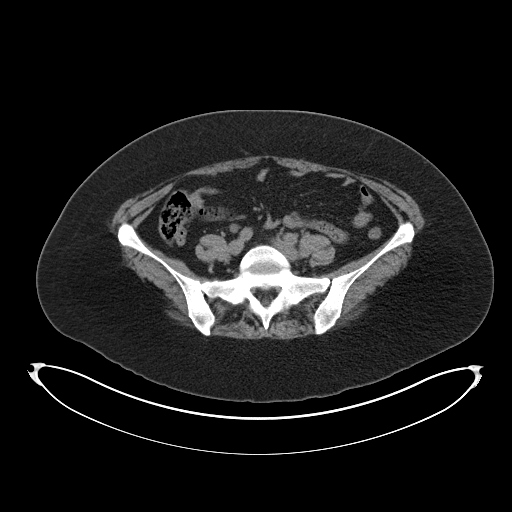
[im 52/100  soft-tissue]
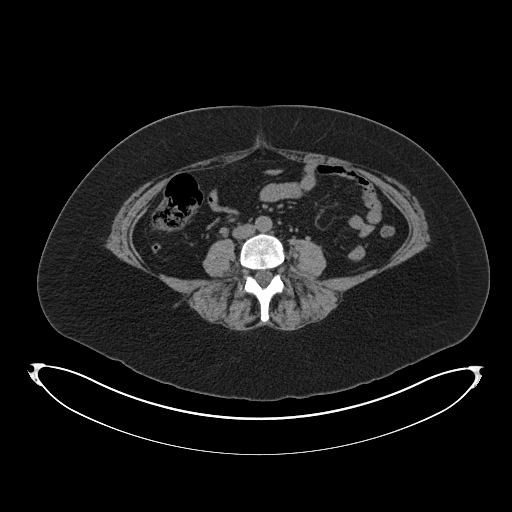
[im 57/100  soft-tissue]
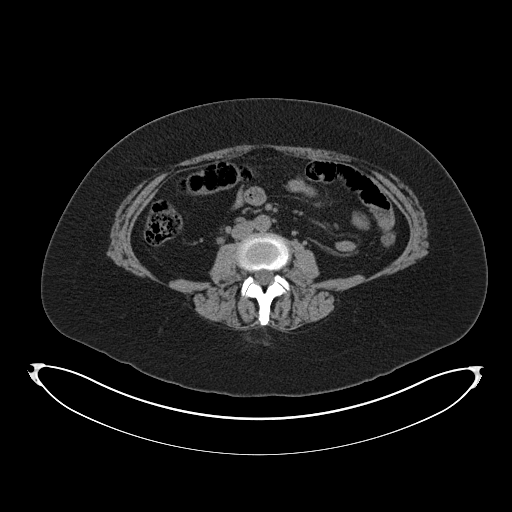
[im 67/100  soft-tissue]
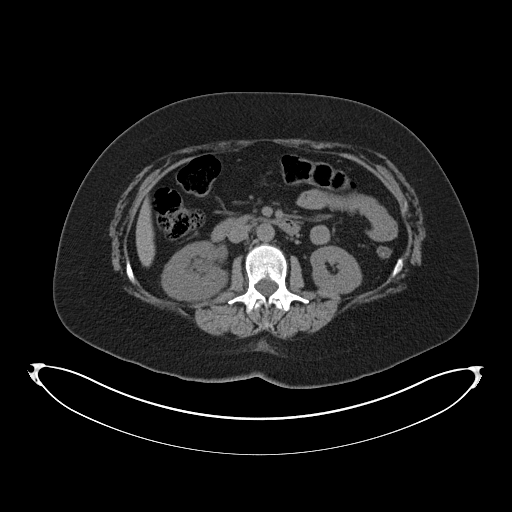
[im 67/100  bone]
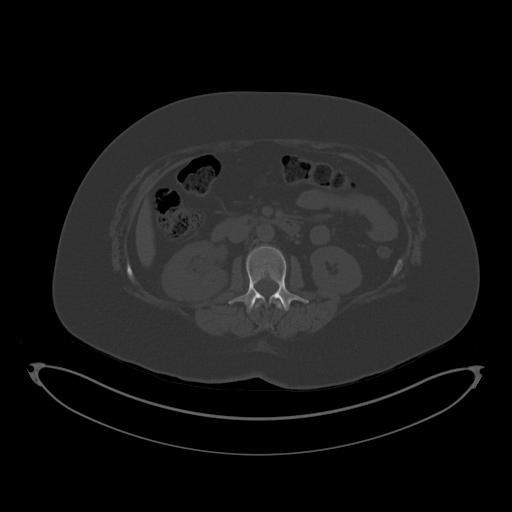
[im 71/100  soft-tissue]
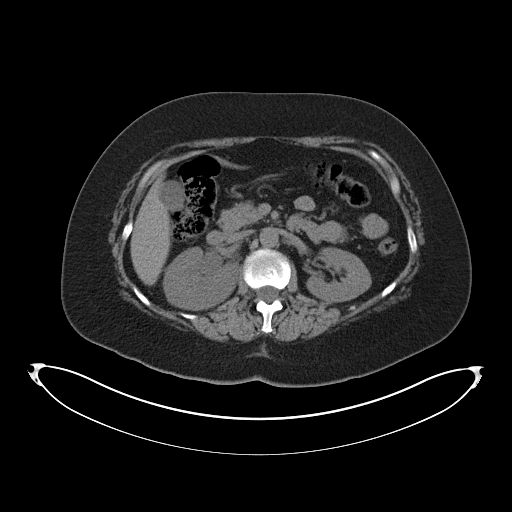
[im 81/100  soft-tissue]
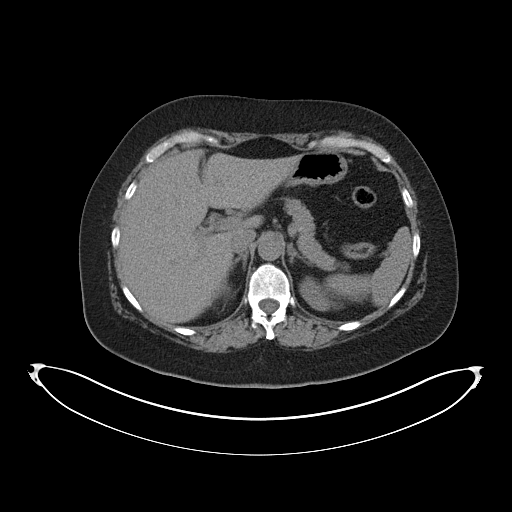
[im 85/100  soft-tissue]
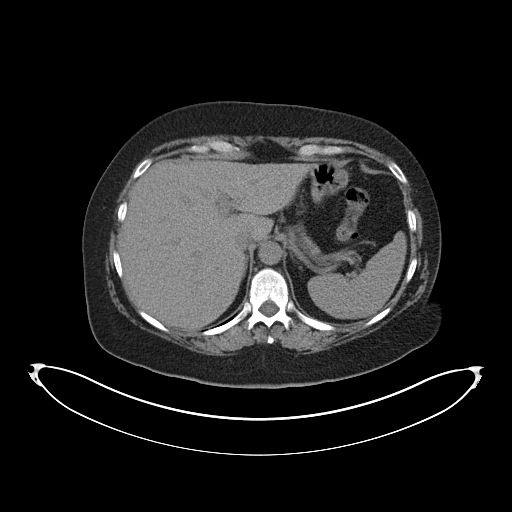
[im 95/100  soft-tissue]
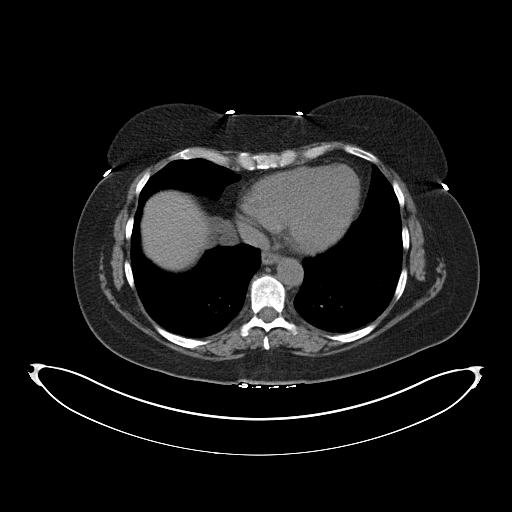

[Series 5: coronal · coronal · 0.94mm/px · 3 of 167 slices shown]
[im 56/167  soft-tissue]
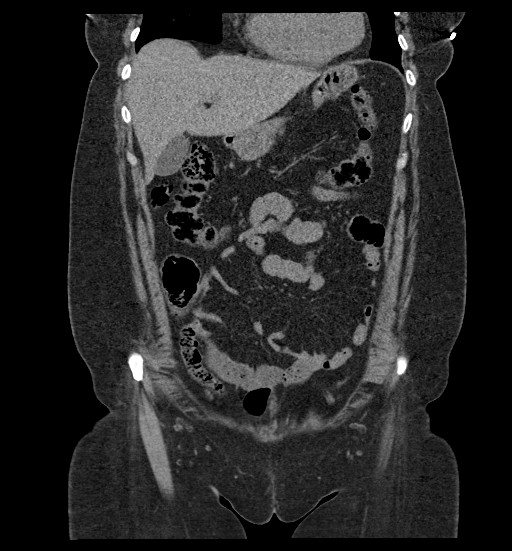
[im 74/167  soft-tissue]
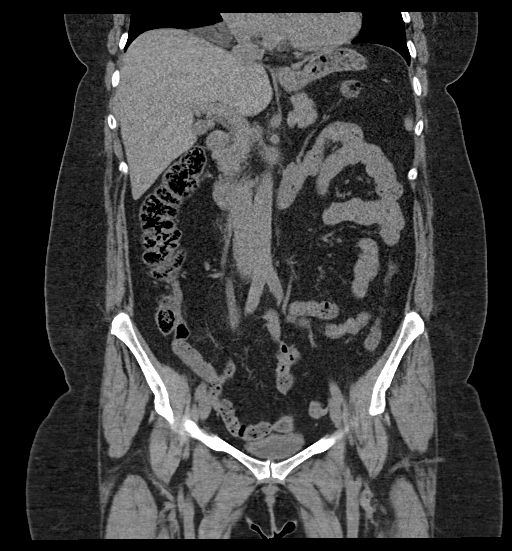
[im 93/167  soft-tissue]
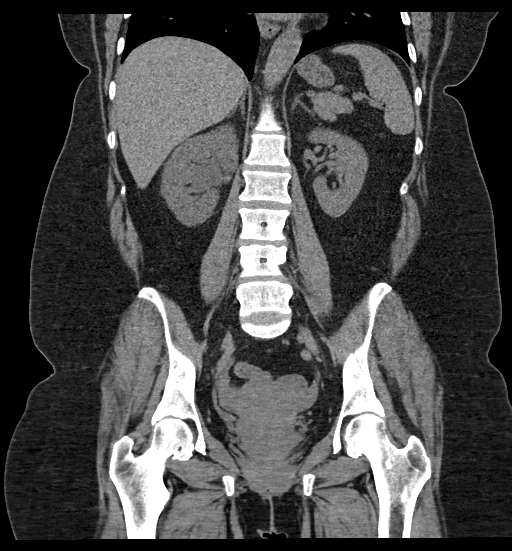

[16 of 46 positions shown; findings below may reference images not displayed]

FINDINGS: Lower chest: Unremarkable

Hepatobiliary: No focal abnormality in the liver on this study
without intravenous contrast. There is no evidence for gallstones,
gallbladder wall thickening, or pericholecystic fluid. No
intrahepatic or extrahepatic biliary dilation.

Pancreas: No focal mass lesion. No dilatation of the main duct. No
intraparenchymal cyst. No peripancreatic edema.

Spleen: No splenomegaly. No focal mass lesion.

Adrenals/Urinary Tract: No adrenal nodule or mass. Left kidney and
ureter unremarkable. Perinephric edema noted around the right kidney
with mild fullness of the right intrarenal collecting system and
ureter. 3 mm distal right ureteral stone identified just proximal to
the UVJ (axial 81/2). Bladder is decompressed but unremarkable.

Stomach/Bowel: Stomach is unremarkable. No gastric wall thickening.
No evidence of outlet obstruction. Duodenum is normally positioned
as is the ligament of Treitz. No small bowel wall thickening. No
small bowel dilatation. The terminal ileum is normal. The appendix
is normal. No gross colonic mass. No colonic wall thickening.

Vascular/Lymphatic: No abdominal aortic aneurysm. There is no
gastrohepatic or hepatoduodenal ligament lymphadenopathy. No
retroperitoneal or mesenteric lymphadenopathy. No pelvic sidewall
lymphadenopathy.

Reproductive: The uterus is unremarkable.  There is no adnexal mass.

Other: No intraperitoneal free fluid.

Musculoskeletal: No worrisome lytic or sclerotic osseous
abnormality.
IMPRESSION: 1. 3 mm distal right ureteral stone with mild secondary changes in
the right kidney and ureter.
2. Otherwise unremarkable exam.

## 2024-05-20 ENCOUNTER — Other Ambulatory Visit: Payer: Self-pay | Admitting: Obstetrics and Gynecology

## 2024-05-20 DIAGNOSIS — R928 Other abnormal and inconclusive findings on diagnostic imaging of breast: Secondary | ICD-10-CM

## 2024-05-21 ENCOUNTER — Inpatient Hospital Stay
Admission: RE | Admit: 2024-05-21 | Discharge: 2024-05-21 | Attending: Obstetrics and Gynecology | Admitting: Obstetrics and Gynecology

## 2024-05-21 DIAGNOSIS — R928 Other abnormal and inconclusive findings on diagnostic imaging of breast: Secondary | ICD-10-CM

## 2024-05-22 ENCOUNTER — Other Ambulatory Visit: Payer: Self-pay | Admitting: Obstetrics and Gynecology

## 2024-05-22 DIAGNOSIS — R921 Mammographic calcification found on diagnostic imaging of breast: Secondary | ICD-10-CM

## 2024-05-22 DIAGNOSIS — R928 Other abnormal and inconclusive findings on diagnostic imaging of breast: Secondary | ICD-10-CM

## 2024-06-05 ENCOUNTER — Ambulatory Visit
Admission: RE | Admit: 2024-06-05 | Discharge: 2024-06-05 | Disposition: A | Discharge status: Home / Self Care | Source: Ambulatory Visit | Attending: Obstetrics and Gynecology | Admitting: Obstetrics and Gynecology

## 2024-06-05 DIAGNOSIS — R928 Other abnormal and inconclusive findings on diagnostic imaging of breast: Secondary | ICD-10-CM

## 2024-06-05 DIAGNOSIS — R921 Mammographic calcification found on diagnostic imaging of breast: Secondary | ICD-10-CM

## 2024-06-05 HISTORY — PX: BREAST BIOPSY: SHX20

## 2024-06-07 LAB — SURGICAL PATHOLOGY
